# Patient Record
Sex: Male | Born: 1995 | Race: White | Hispanic: No | Marital: Single | Smoking: Never smoker
Health system: Southern US, Community
[De-identification: ages and names within clinical notes are randomized; demographics above are authoritative.]

## PROBLEM LIST (undated history)

## (undated) DIAGNOSIS — F845 Asperger's syndrome: Secondary | ICD-10-CM

## (undated) HISTORY — DX: Asperger's syndrome: F84.5

---
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
## 2014-10-09 ENCOUNTER — Encounter: Payer: Self-pay | Admitting: Medical

## 2014-10-09 ENCOUNTER — Ambulatory Visit (INDEPENDENT_AMBULATORY_CARE_PROVIDER_SITE_OTHER): Payer: BLUE CROSS/BLUE SHIELD | Admitting: Medical

## 2014-10-09 VITALS — BP 110/67 | HR 61 | Temp 97.7°F | Ht 71.2 in | Wt 153.2 lb

## 2014-10-09 DIAGNOSIS — Z23 Encounter for immunization: Secondary | ICD-10-CM

## 2014-10-09 DIAGNOSIS — Z0000 Encounter for general adult medical examination without abnormal findings: Secondary | ICD-10-CM

## 2014-10-09 DIAGNOSIS — Z0189 Encounter for other specified special examinations: Secondary | ICD-10-CM

## 2014-10-09 LAB — POCT URINALYSIS DIPSTICK
BILIRUBIN UA: NEGATIVE
Blood, UA: NEGATIVE
GLUCOSE UA: NEGATIVE
KETONES UA: NEGATIVE
LEUKOCYTES UA: NEGATIVE
Nitrite, UA: NEGATIVE
SPEC GRAV UA: 1.01
Urobilinogen, UA: 0.2
pH, UA: 6.5

## 2014-10-09 NOTE — Assessment & Plan Note (Addendum)
You  will get flu vaccine and tdap today.  You  declined labs due to history of sycnope on lab draw and by his age group not indicated. Your  mom wanted you  to get the exam. If after discussion with your mom, you both decide to get labs then notify us so we can put labs in + have  someone come with you and drive you home.  Follow up 1 yr or as needed any new signs, symptoms or medical problems.

## 2014-10-09 NOTE — Progress Notes (Signed)
Subjective:    Patient ID: Seth Miller, male    DOB: 07/14/1996, 19 y.o.   MRN: [id number]  HPI   I have reviewed pt PMH, PSH, FH, Social History and Surgical History  Chart list asperger syndrome. None other. On revew states high functioning in his chart. No surgeries.  Pt in school. Studying Aviation maintenance. Exercises twice a week doing cardio. No caffeine, single.  Pt mom wanted him to have basic check up. On review of systems pt review was negative.  Will get flu vaccine today. Pt ok with this.  Pt will get tdap.  Hx of vasovagal syncope on lab draws in the past. Pt states vaccines have never been a problem.       Past Medical History  Diagnosis Date  . Asperger syndrome     High functioning per mom    History   Social History  . Marital Status: Single    Spouse Name: N/A    Number of Children: N/A  . Years of Education: N/A   Occupational History  . Not on file.   Social History Main Topics  . Smoking status: Never Smoker   . Smokeless tobacco: Never Used  . Alcohol Use: No  . Drug Use: No  . Sexual Activity: No   Other Topics Concern  . Not on file   Social History Narrative   Senior at [employer].    No past surgical history on file.  Family History  Problem Relation Age of Onset  . Cancer Maternal Grandmother     breast  . Heart disease Maternal Grandfather     No Known Allergies  Current Outpatient Prescriptions on File Prior to Visit  Medication Sig Dispense Refill  . doxycycline (VIBRAMYCIN) 100 MG capsule Take 1 capsule (100 mg total) by mouth 2 (two) times daily. (Patient not taking: Reported on 10/09/2014) 14 capsule 0  . ibuprofen (ADVIL,MOTRIN) 200 MG tablet Take 200 mg by mouth every 6 (six) hours as needed.    . promethazine (PHENERGAN) 12.5 MG suppository 1-2 suppositories PR q6h prn (Patient not taking: Reported on 10/09/2014) 12 each 0  . promethazine (PHENERGAN) 12.5 MG tablet 1-2 tabs po q6h prn nausea (Patient not  taking: Reported on 10/09/2014) 20 tablet 0   No current facility-administered medications on file prior to visit.    BP 110/67 mmHg  Pulse 61  Temp(Src) 97.7 F (36.5 C) (Oral)  Ht 5' 11.2" (1.808 m)  Wt 153 lb 3.2 oz (69.491 kg)  BMI 21.26 kg/m2  SpO2 98%            Review of Systems  Constitutional: Negative for fever, chills and fatigue.  HENT: Negative for congestion, ear discharge, ear pain, hearing loss, nosebleeds, postnasal drip, rhinorrhea, sinus pressure, sneezing and trouble swallowing.   Eyes: Negative for photophobia, pain, redness, itching and visual disturbance.  Respiratory: Negative for cough, chest tightness, shortness of breath and wheezing.   Cardiovascular: Negative for chest pain and palpitations.  Gastrointestinal: Negative for nausea, vomiting, abdominal pain, diarrhea, constipation, blood in stool and abdominal distention.  Endocrine: Negative for polydipsia, polyphagia and polyuria.  Genitourinary: Negative for dysuria, urgency, frequency, flank pain, genital sores and testicular pain.  Musculoskeletal: Negative for back pain and arthralgias.  Skin: Negative for rash.  Allergic/Immunologic: Negative for environmental allergies and food allergies.  Neurological: Negative for dizziness, tremors, syncope, light-headedness and headaches.  Hematological: Negative for adenopathy. Does not bruise/bleed easily.  Psychiatric/Behavioral: Negative for suicidal ideas, sleep  disturbance, dysphoric mood, decreased concentration and agitation. The patient is not nervous/anxious.        Objective:   Physical Exam   General Mental Status- Alert. Orientation- Oriented x3.  Build and Nutrition- Well nourished and Well Developed.  Skin General:-Normal. Color- Normal color. Moisture- Normal. Temperature-Warm.  HEENT  Ears- Normal. Auditory Canal- Bilateral-Normal. Tympanic Membrane- Bilateral-Normal. Eye Fundi-Bilateral-Normal. Pupil- bilateral- Direct  reaction to light normal. Nose & Sinuses- Normal. Nostrils-Bilateral- Normal. Mouth & Throat-Normal.  Neck Neck- No Bruits or Masses. Trachea midline.  Thyroid- Normal.  Chest and Lung Exam Percussion: Quality and Intensity-Percussion normal. Percussion of the chest reveals- No Dullness.  Palpation: Palpation of the chest reveals- Non-tender- No dullness. Auscultation: Breath Sounds- Normal.  Adventitous Sounds:-No adventitious sounds.  Cardiovascular Inspection:- No Heaves. Auscultation:-Normal sinus rhythm without murmur gallop, S1 WNL and S2 WNL.  Abdomen Inspection:-Inspection Normal. Inspection of the abdomen reveals- No hernias Palpation/Percussion:- Palpation and Percussion of the Abdomen reveal- Non Tender and No Palpable abdominal masses. Liver: Other Characteristics- No hepatomegaly. Spleen:Other Characteristics- No Splenomegaly. Auscultation:- Auscultation of the abdomen reveals- Bowel sounds normal and No Abdominal bruits.  Male Genitourinary Urethra:- No discharge. Penis- Circumcised. Scrotum- No masses. Testes- Bilateral-Normal.   Neurologic Mental Status:- Normal. Cranial Nerves:-Normal Bilaterally. Motor:-Normal. Strength:5/5 normal muscle strength-All Muscles. General Assessment of Reflexes: Right Knee-2+. Left Knee- 2+. Coordination-Normal. Gait- Normal.  Meningeal Signs- None.  Musculoskeletal Global Assessment General-Joints show full range of motion without obvious deformity and Normal muscle mass. Strength in upper and lower extremities.  Lymphatics General lymphatics Description- No generalized lymphadenopathy.       Assessment & Plan:

## 2014-10-09 NOTE — Patient Instructions (Addendum)
You  will get flu vaccine and tdap today.(since pt assured me he has no problems with vaccine and indicated)  You  declined labs due to history of sycnope on lab draw and by his age group not indicated. Your  mom wanted you  to get the exam. If after discussion with your mom, you both decide to get labs then notify us so we can put labs in + have someone come with you and drive you home.(after today event we would be extremely cautious advise hydrating and eating before. We would draw blood and let you lay supine)  Follow up 1 yr(regular checkup) or as needed any new signs, symptoms or medical problems.   I do recommend he hydrate well and eat a meal when leaves. If he has any near sycope or syncope then ED evaluation. Follow up in 5 days recheck on near syncope since he is new pt or prn. May do repeat blood sugar and see if again on low side since after drinking small orange juice his bs was 83.  Note today pt did not have syncopal episode but was near syncope. I came to room as he looked very close to. He looked pale and weak. He communicated with [place] entire time. His bp was low approximate 70/30 taken by nurse. His finger tips were cold. We waited and his bp gradually came up to 110/70. His pulse came up to lower 60 and last pulse check was 58. He ambulated down hall without dizziness or recurrent near syncope. His urine test was done to see if he had ketone/mild dehydration. That was negative. BS after small glass of of was 83. So appears combination of vaccine needle prick and low bs contributed to the near syncopal/vasovagal  event. Education information on vasovagal response given to mom and pt.  Not 02% was constantly in 96% or above on various rechecks.  Called pt after he left and he states ate meal and was feeling great.    Vasovagal Syncope, Adult Syncope, commonly known as fainting, is a temporary loss of consciousness. It occurs when the blood flow to the brain is reduced. Vasovagal  syncope (also called neurocardiogenic syncope) is a fainting spell in which the blood flow to the brain is reduced because of a sudden drop in heart rate and blood pressure. Vasovagal syncope occurs when the brain and the cardiovascular system (blood vessels) do not adequately communicate and respond to each other. This is the most common cause of fainting. It often occurs in response to fear or some other type of emotional or physical stress. The body has a reaction in which the heart starts beating too slowly or the blood vessels expand, reducing blood pressure. This type of fainting spell is generally considered harmless. However, injuries can occur if a person takes a sudden fall during a fainting spell.  CAUSES  Vasovagal syncope occurs when a person's blood pressure and heart rate decrease suddenly, usually in response to a trigger. Many things and situations can trigger an episode. Some of these include:   Pain.   Fear.   The sight of blood or medical procedures, such as blood being drawn from a vein.   Common activities, such as coughing, swallowing, stretching, or going to the bathroom.   Emotional stress.   Prolonged standing, especially in a warm environment.   Lack of sleep or rest.   Prolonged lack of food.   Prolonged lack of fluids.   Recent illness.  The use of  certain drugs that affect blood pressure, such as cocaine, alcohol, marijuana, inhalants, and opiates.  SYMPTOMS  Before the fainting episode, you may:   Feel dizzy or light headed.   Become pale.  Sense that you are going to faint.   Feel like the room is spinning.   Have tunnel vision, only seeing directly in front of you.   Feel sick to your stomach (nauseous).   See spots or slowly lose vision.   Hear ringing in your ears.   Have a headache.   Feel warm and sweaty.   Feel a sensation of pins and needles. During the fainting spell, you will generally be unconscious for no  longer than a couple minutes before waking up and returning to normal. If you get up too quickly before your body can recover, you may faint again. Some twitching or jerky movements may occur during the fainting spell.  DIAGNOSIS  Your caregiver will ask about your symptoms, take a medical history, and perform a physical exam. Various tests may be done to rule out other causes of fainting. These may include blood tests and tests to check the heart, such as electrocardiography, echocardiography, and possibly an electrophysiology study. When other causes have been ruled out, a test may be done to check the body's response to changes in position (tilt table test). TREATMENT  Most cases of vasovagal syncope do not require treatment. Your caregiver may recommend ways to avoid fainting triggers and may provide home strategies for preventing fainting. If you must be exposed to a possible trigger, you can drink additional fluids to help reduce your chances of having an episode of vasovagal syncope. If you have warning signs of an oncoming episode, you can respond by positioning yourself favorably (lying down). If your fainting spells continue, you may be given medicines to prevent fainting. Some medicines may help make you more resistant to repeated episodes of vasovagal syncope. Special exercises or compression stockings may be recommended. In rare cases, the surgical placement of a pacemaker is considered. HOME CARE INSTRUCTIONS   Learn to identify the warning signs of vasovagal syncope.   Sit or lie down at the first warning sign of a fainting spell. If sitting, put your head down between your legs. If you lie down, swing your legs up in the air to increase blood flow to the brain.   Avoid hot tubs and saunas.  Avoid prolonged standing.  Drink enough fluids to keep your urine clear or pale yellow. Avoid caffeine.  Increase salt in your diet as directed by your caregiver.   If you have to stand for  a long time, perform movements such as:   Crossing your legs.   Flexing and stretching your leg muscles.   Squatting.   Moving your legs.   Bending over.   Only take over-the-counter or prescription medicines as directed by your caregiver. Do not suddenly stop any medicines without asking your caregiver first. SEEK MEDICAL CARE IF:   Your fainting spells continue or happen more frequently in spite of treatment.   You lose consciousness for more than a couple minutes.  You have fainting spells during or after exercising or after being startled.   You have new symptoms that occur with the fainting spells, such as:   Shortness of breath.  Chest pain.   Irregular heartbeat.   You have episodes of twitching or jerky movements that last longer than a few seconds.  You have episodes of twitching or jerky movements without  obvious fainting. SEEK IMMEDIATE MEDICAL CARE IF:   You have injuries or bleeding after a fainting spell.   You have episodes of twitching or jerky movements that last longer than 5 minutes.   You have more than one spell of twitching or jerky movements before returning to consciousness after fainting. MAKE SURE YOU:   Understand these instructions.  Will watch your condition.  Will get help right away if you are not doing well or get worse. Document Released: 09/04/2012 Document Reviewed: 09/04/2012 [facility] Patient Information 2015 [place], [place]. This information is not intended to replace advice given to you by your health care provider. Make sure you discuss any questions you have with your health care provider.

## 2014-10-09 NOTE — Progress Notes (Signed)
Pre visit review using our clinic review tool, if applicable. No additional management support is needed unless otherwise documented below in the visit note. 

## 2014-10-16 ENCOUNTER — Ambulatory Visit (INDEPENDENT_AMBULATORY_CARE_PROVIDER_SITE_OTHER): Payer: BLUE CROSS/BLUE SHIELD | Admitting: Medical

## 2014-10-16 ENCOUNTER — Encounter: Payer: Self-pay | Admitting: Medical

## 2014-10-16 VITALS — BP 103/57 | HR 68 | Temp 97.4°F | Ht >= 80 in | Wt 151.6 lb

## 2014-10-16 DIAGNOSIS — R55 Syncope and collapse: Secondary | ICD-10-CM

## 2014-10-16 NOTE — Assessment & Plan Note (Signed)
You have done well since your near syncope from vaccines. I do not think based on your age and history that labs to accompany your prior physical is indicated.   I would monitor how you feel and if you get any signs or symptoms of illness let us know and then could do labs.  If you do ever get labs make sure you are well hydrated and someone accompanies you to the office.   Follow up prn.

## 2014-10-16 NOTE — Progress Notes (Signed)
   Subjective:    Patient ID: Seth Miller, male    DOB: 20-Aug-1996, 19 y.o.   MRN: [id number]  HPI   Pt in for follow up on near syncope type episode.  When he had vaccines done on his wellness exam. Since then he has no near synope of syncope type episodes. No chest pain. No ha. No fatigue. No fevers, no chills, and no neurologic type signs or symptoms. Pt pulse is 68. Pt blood pressure is 103/57. Pt had 2 syncopal episodes in the past. One  time was with vaccines. The other time was with flu illness.  Past Medical History  Diagnosis Date  . Asperger syndrome     High functioning per mom    History   Social History  . Marital Status: Single    Spouse Name: N/A    Number of Children: N/A  . Years of Education: N/A   Occupational History  . Not on file.   Social History Main Topics  . Smoking status: Never Smoker   . Smokeless tobacco: Never Used  . Alcohol Use: No  . Drug Use: No  . Sexual Activity: No   Other Topics Concern  . Not on file   Social History Narrative   Senior at [employer].    No past surgical history on file.  Family History  Problem Relation Age of Onset  . Cancer Maternal Grandmother     breast  . Heart disease Maternal Grandfather     No Known Allergies  No current outpatient prescriptions on file prior to visit.   No current facility-administered medications on file prior to visit.    BP 103/57 mmHg  Pulse 68  Temp(Src) 97.4 F (36.3 C) (Oral)  Ht 6' 11.2" (2.113 m)  Wt 151 lb 9.6 oz (68.765 kg)  BMI 15.40 kg/m2  SpO2 99%      Review of Systems  Constitutional: Negative for fever, chills, diaphoresis, activity change and fatigue.  Respiratory: Negative for cough, chest tightness and shortness of breath.   Cardiovascular: Negative for chest pain, palpitations and leg swelling.  Gastrointestinal: Negative for nausea, vomiting and abdominal pain.  Musculoskeletal: Negative for neck pain and neck stiffness.  Neurological:  Negative for dizziness, tremors, seizures, syncope, facial asymmetry, speech difficulty, weakness, light-headedness, numbness and headaches.  Psychiatric/Behavioral: Negative for behavioral problems, confusion and agitation. The patient is not nervous/anxious.        Objective:   Physical Exam   General Mental Status- Alert. General Appearance- Not in acute distress.   Skin General: Color- Normal Color. Moisture- Normal Moisture.  Neck Carotid Arteries- Normal color. Moisture- Normal Moisture. No carotid bruits. No JVD.  Chest and Lung Exam Auscultation: Breath Sounds:-Normal.  Cardiovascular Auscultation:Rythm- Regular. Murmurs & Other Heart Sounds:Auscultation of the heart reveals- No Murmurs.  Abdomen Inspection:-Inspeection Normal. Palpation/Percussion:Note:No mass. Palpation and Percussion of the abdomen reveal- Non Tender, Non Distended + BS, no rebound or guarding.    Neurologic Cranial Nerve exam:- CN III-XII intact(No nystagmus), symmetric smile. Drift Test:- No drift. Romberg Exam:- Negative.  Heal to Toe Gait exam:-Normal. Finger to Nose:- Normal/Intact Strength:- 5/5 equal and symmetric strength both upper and lower extremities.       Assessment & Plan:

## 2014-10-16 NOTE — Patient Instructions (Signed)
You have done well since your near syncope from vaccines. I do not think based on your age and history that labs to accompany your prior physical is indicated.   I would monitor how you feel and if you get any signs or symptoms of illness let us know and then could do labs.  If you do ever get labs make sure you are well hydrated and someone accompanies you to the office.   Follow up prn.
